# Patient Record
Sex: Male | Born: 1980 | Race: Black or African American | Hispanic: No | Marital: Married | Smoking: Never smoker
Health system: Southern US, Community
[De-identification: ages and names within clinical notes are randomized; demographics above are authoritative.]

---

## 2005-03-07 ENCOUNTER — Inpatient Hospital Stay (HOSPITAL_COMMUNITY): Admission: EM | Admit: 2005-03-07 | Discharge: 2005-03-12 | Payer: Self-pay | Admitting: Emergency Medicine

## 2005-03-07 ENCOUNTER — Ambulatory Visit: Payer: Self-pay | Admitting: Emergency Medicine

## 2005-03-08 ENCOUNTER — Other Ambulatory Visit: Admission: RE | Admit: 2005-03-08 | Discharge: 2005-03-08 | Payer: Self-pay | Admitting: Internal Medicine

## 2005-03-17 ENCOUNTER — Ambulatory Visit: Payer: Self-pay | Admitting: Emergency Medicine

## 2005-03-25 ENCOUNTER — Ambulatory Visit (HOSPITAL_COMMUNITY): Admission: RE | Admit: 2005-03-25 | Discharge: 2005-03-25 | Payer: Self-pay | Admitting: Emergency Medicine

## 2005-04-12 ENCOUNTER — Ambulatory Visit: Payer: Self-pay | Admitting: Emergency Medicine

## 2005-04-28 ENCOUNTER — Ambulatory Visit: Payer: Self-pay | Admitting: Emergency Medicine

## 2005-06-06 ENCOUNTER — Ambulatory Visit: Payer: Self-pay | Admitting: Internal Medicine

## 2005-09-13 ENCOUNTER — Ambulatory Visit: Payer: Self-pay | Admitting: Emergency Medicine

## 2005-11-08 ENCOUNTER — Ambulatory Visit: Payer: Self-pay | Admitting: Emergency Medicine

## 2005-12-02 ENCOUNTER — Ambulatory Visit: Payer: Self-pay | Admitting: Emergency Medicine

## 2006-09-12 IMAGING — CR DG CHEST 2V
2 series · 2 of 2 positions shown · non-contrast
Comparison: none

CLINICAL DATA: Cough and fever. 
 CHEST - 2 VIEW: 
 Mediastinal/hilar prominence on the lateral film is worrisome for adenopathy.  Peribronchial thickening is identified. No definite focal airspace disease or pleural effusions.  Bony thorax is unremarkable.

[w chest pa]
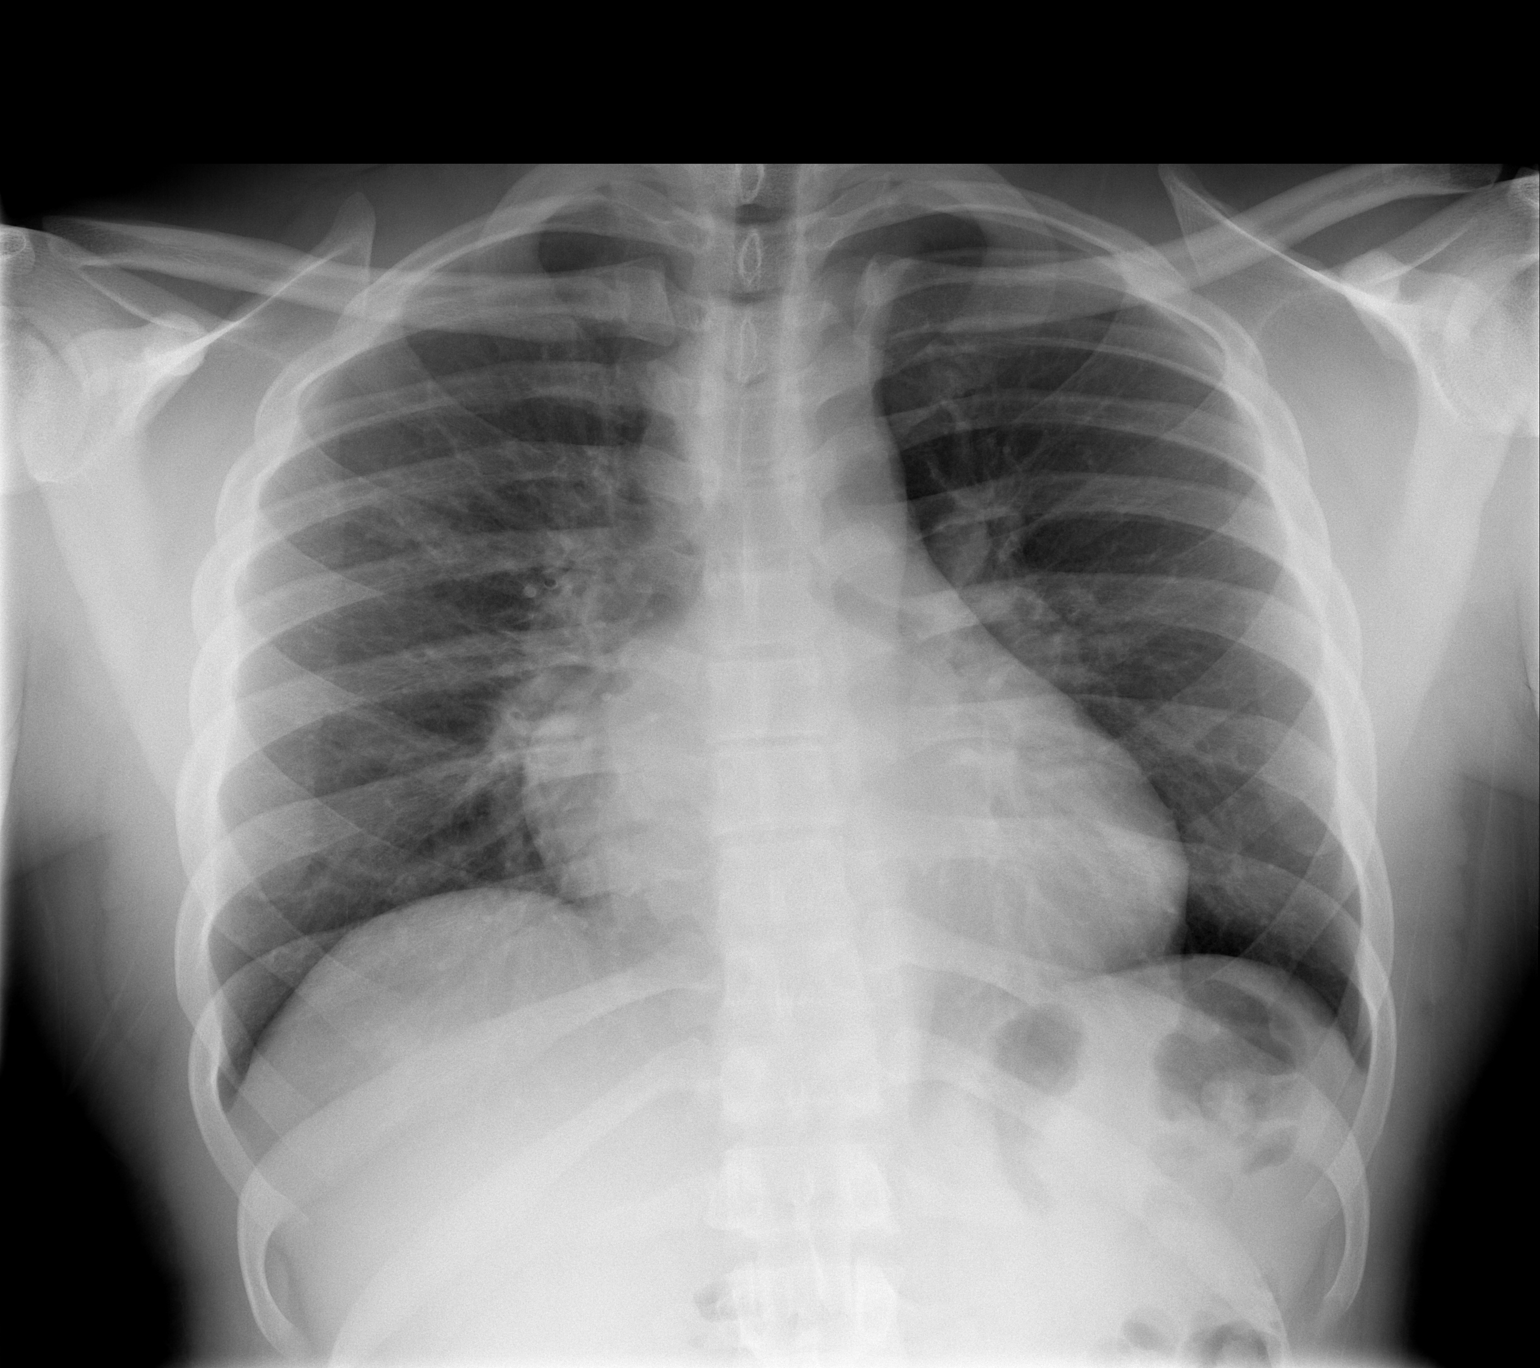

[w chest lat]
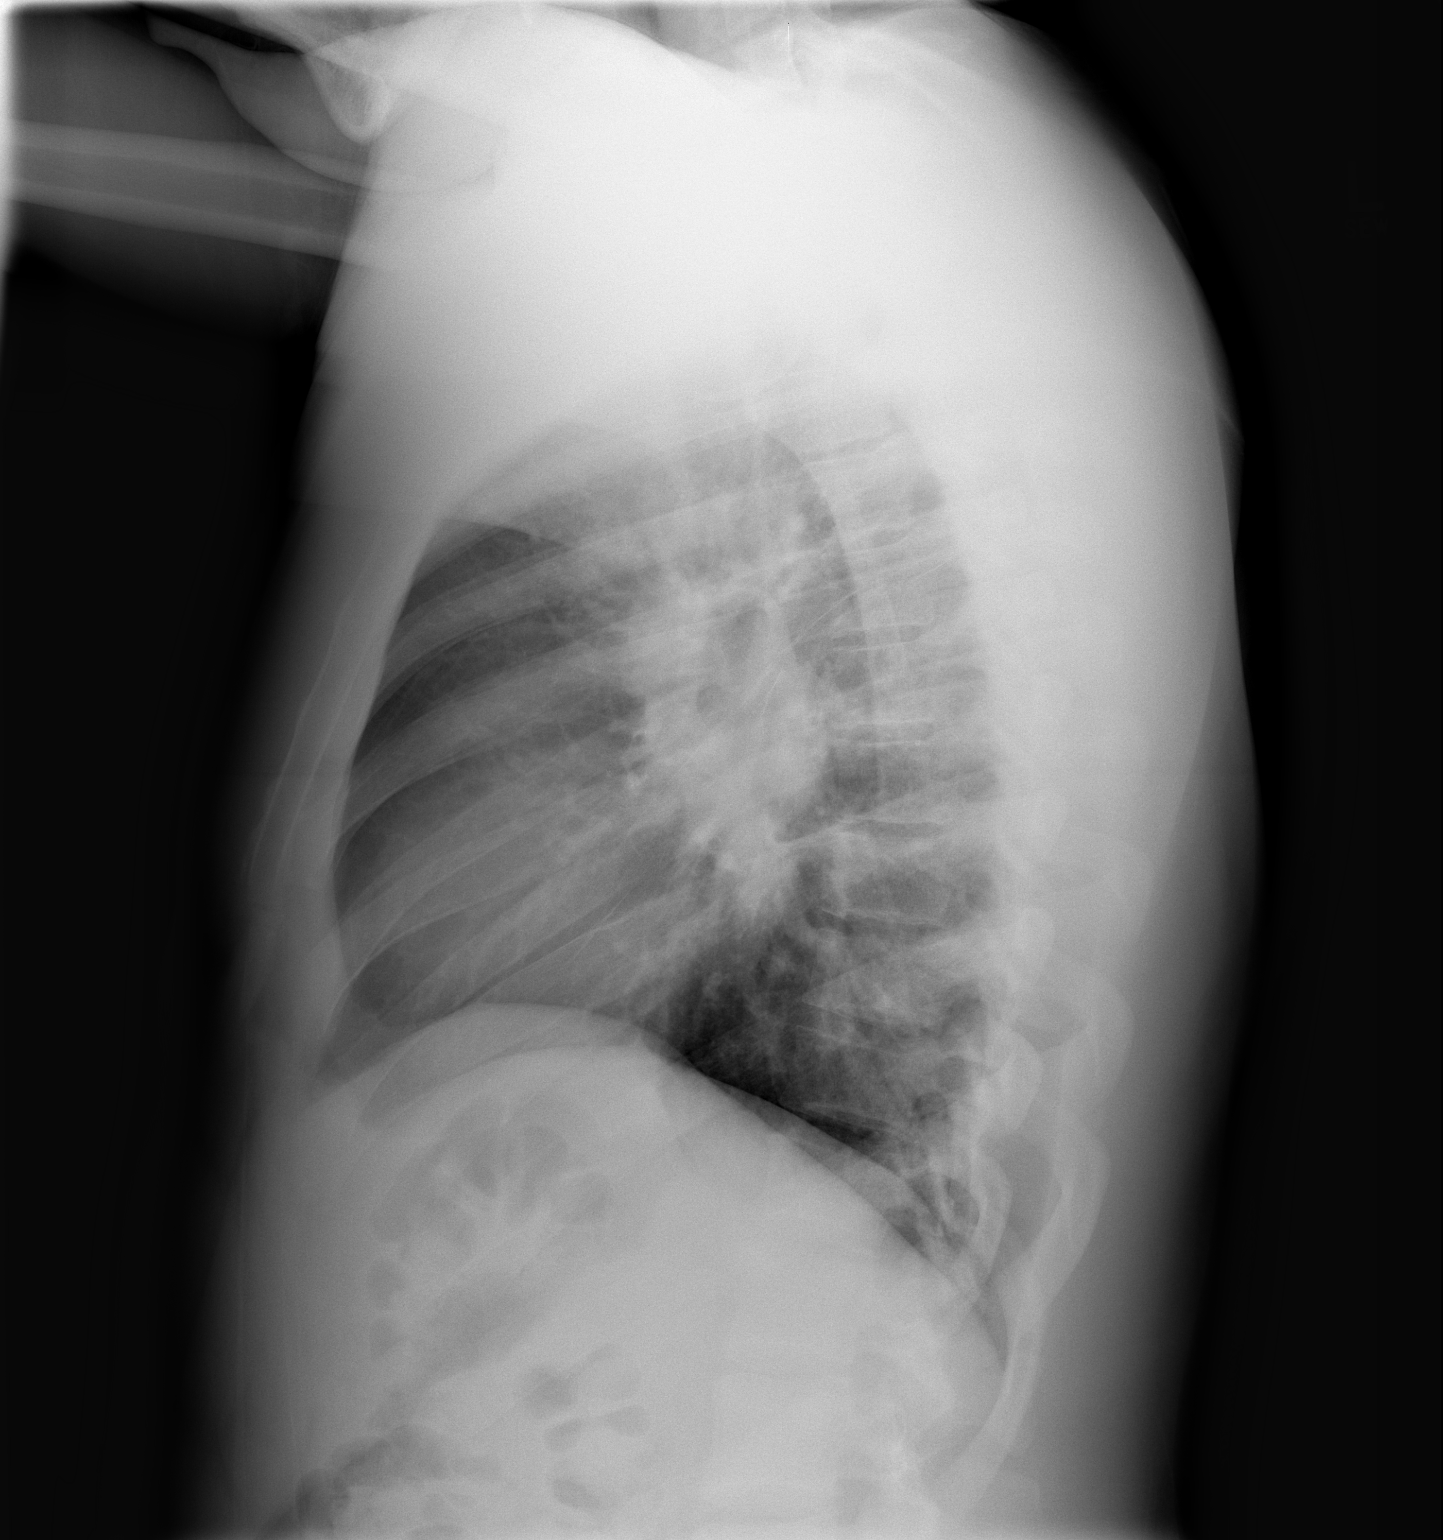

[2 of 2 positions shown; findings below may reference images not displayed]

IMPRESSION: 1.  Hilar/mediastinal prominence on the lateral film worrisome for adenopathy.  Recommend CT for further evaluation. 
 2.  Peribronchial thickening without focal airspace disease.

## 2007-03-24 DIAGNOSIS — R0989 Other specified symptoms and signs involving the circulatory and respiratory systems: Secondary | ICD-10-CM

## 2007-03-24 DIAGNOSIS — R0609 Other forms of dyspnea: Secondary | ICD-10-CM | POA: Insufficient documentation

## 2007-03-24 DIAGNOSIS — D869 Sarcoidosis, unspecified: Secondary | ICD-10-CM | POA: Insufficient documentation

## 2016-02-18 ENCOUNTER — Encounter: Payer: Self-pay | Admitting: *Deleted

## 2016-02-18 ENCOUNTER — Emergency Department (INDEPENDENT_AMBULATORY_CARE_PROVIDER_SITE_OTHER)
Admission: EM | Admit: 2016-02-18 | Discharge: 2016-02-18 | Disposition: A | Discharge status: Home / Self Care | Payer: 59 | Source: Home / Self Care | Attending: Family Medicine | Admitting: Family Medicine

## 2016-02-18 DIAGNOSIS — S39012A Strain of muscle, fascia and tendon of lower back, initial encounter: Secondary | ICD-10-CM | POA: Diagnosis not present

## 2016-02-18 MED ORDER — MELOXICAM 15 MG PO TABS: 15.0000 mg | ORAL_TABLET | Freq: Every day | ORAL | 0 refills | Status: AC

## 2016-02-18 MED ORDER — CYCLOBENZAPRINE HCL 10 MG PO TABS: 10.0000 mg | ORAL_TABLET | Freq: Three times a day (TID) | ORAL | 0 refills | Status: AC

## 2016-02-18 MED ORDER — IBUPROFEN 600 MG PO TABS
600.0000 mg | ORAL_TABLET | Freq: Once | ORAL | Status: AC
  Administered 2016-02-18: 600 mg via ORAL

## 2016-02-18 MED ORDER — HYDROCODONE-ACETAMINOPHEN 5-325 MG PO TABS: ORAL_TABLET | ORAL | 0 refills | Status: AC

## 2016-02-18 NOTE — ED Triage Notes (Signed)
Pt reports falling down 1 stair 5 days ago. C/o low back pain that radiates to his hips since. No previous injuries. Taking Aleve and Tylenol @ home.

## 2016-02-18 NOTE — Discharge Instructions (Signed)
Apply ice pack for 20 to 30 minutes, 3 to 4 times daily  Continue until pain decreases.  °Begin range of motion and stretching exercises as tolerated. °

## 2016-02-18 NOTE — ED Provider Notes (Signed)
[facility]    CSN: [id number] Arrival date & time: 02/18/16  [id number]     History   Chief Complaint Chief Complaint  Patient presents with  . Back Pain    HPI Jose Quinn is a 35 y.o. male.   Six days ago while walking down stairs, patient missed the last step and almost fell but was able to right himself.  He had no immediate pain, but later in the night and the next morning he developed soreness in his left lower back.  The pain has persisted but does not radiate.   He denies bowel or bladder dysfunction, and no saddle numbness.   The pain is worse each morning upon arising, and his back then "loosens up."   The history is provided by the patient.  Back Pain  Location:  Lumbar spine Quality:  Aching Radiates to:  Does not radiate Pain severity:  Moderate Pain is:  Worse during the night Onset quality:  Gradual Duration:  5 days Timing:  Intermittent Progression:  Unchanged Chronicity:  New Context: falling   Relieved by:  Nothing Worsened by:  Bending Ineffective treatments:  NSAIDs Associated symptoms: no abdominal pain, no abdominal swelling, no bladder incontinence, no bowel incontinence, no chest pain, no dysuria, no fever, no headaches, no leg pain, no numbness, no paresthesias, no pelvic pain, no perianal numbness, no tingling, no weakness and no weight loss     History reviewed. No pertinent past medical history.  Patient Active Problem List   Diagnosis Date Noted  . 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 DYSPNEA ON EXERTION 03/24/2007    History reviewed. No pertinent surgical history.     Home Medications    Prior to Admission medications   Medication Sig Start Date End Date Taking? Authorizing Provider  EPINEPHrine 0.3 mg/0.3 mL IJ SOAJ injection Inject into the muscle once.   Yes Historical Provider, MD  cyclobenzaprine (FLEXERIL) 10 MG tablet Take 1 tablet (10 mg total) by mouth 3 (three) times daily. (for muscle spasm) 02/18/16    Lattie HawStephen A Evalise Abruzzese, MD  HYDROcodone-acetaminophen (NORCO/VICODIN) 5-325 MG tablet Take one by mouth at bedtime as needed for pain 02/18/16   Lattie HawStephen A Gordana Kewley, MD  meloxicam (MOBIC) 15 MG tablet Take 1 tablet (15 mg total) by mouth daily. Take with food each morning 02/18/16   Lattie HawStephen A Azriel Dancy, MD    Family History History reviewed. No pertinent family history.  Social History Social History  Substance Use Topics  . Smoking status: Never Smoker  . Smokeless tobacco: Never Used  . Alcohol use Yes     Allergies   Shellfish allergy   Review of Systems Review of Systems  Constitutional: Negative for fever and weight loss.  Cardiovascular: Negative for chest pain.  Gastrointestinal: Negative for abdominal pain and bowel incontinence.  Genitourinary: Negative for bladder incontinence, dysuria and pelvic pain.  Musculoskeletal: Positive for back pain.  Neurological: Negative for tingling, weakness, numbness, headaches and paresthesias.  All other systems reviewed and are negative.    Physical Exam Triage Vital Signs ED Triage Vitals  Enc Vitals Group     BP 02/18/16 0857 149/93     Pulse Rate 02/18/16 0857 77     Resp --      Temp 02/18/16 0857 98.2 F (36.8 C)     Temp Source 02/18/16 0857 Oral     SpO2 02/18/16 0857 99 %     Weight 02/18/16 0858 244 lb (110.7 kg)     Height --  Head Circumference --      Peak Flow --      Pain Score 02/18/16 0859 8     Pain Loc --      Pain Edu? --      Excl. in GC? --    No data found.   Updated Vital Signs BP 149/93 (BP Location: Left Arm)   Pulse 77   Temp 98.2 F (36.8 C) (Oral)   Wt 244 lb (110.7 kg)   SpO2 99%   Visual Acuity Right Eye Distance:   Left Eye Distance:   Bilateral Distance:    Right Eye Near:   Left Eye Near:    Bilateral Near:     Physical Exam  Constitutional: He appears well-developed and well-nourished. No distress.  HENT:  Head: Atraumatic.  Mouth/Throat: Oropharynx is clear and moist.    Eyes: Conjunctivae and EOM are normal. Pupils are equal, round, and reactive to light.  Neck: Normal range of motion.  Cardiovascular: Normal heart sounds.   Pulmonary/Chest: Breath sounds normal.  Abdominal: Soft. There is no tenderness.  Musculoskeletal:       Lumbar back: He exhibits tenderness. He exhibits normal range of motion, no bony tenderness, no swelling and no edema.       Back:  Back:  Range of motion relatively well preserved.  Can heel/toe walk and squat without difficulty.    Tenderness in bilateral paraspinous muscles from L4 to Sacral area.  Straight leg raising test is negative.  Sitting knee extension test is negative.  Strength and sensation in the lower extremities is normal.  Patellar and achilles reflexes are normal   Neurological: He is alert.  Skin: Skin is warm and dry. No rash noted.  Nursing note and vitals reviewed.    UC Treatments / Results  Labs (all labs ordered are listed, but only abnormal results are displayed) Labs Reviewed - No data to display  EKG  EKG Interpretation None       Radiology No results found.  Procedures Procedures (including critical care time)  Medications Ordered in UC Medications  ibuprofen (ADVIL,MOTRIN) tablet 600 mg (600 mg Oral Given 02/18/16 0900)     Initial Impression / Assessment and Plan / UC Course  I have reviewed the triage vital signs and the nursing notes.  Pertinent labs & imaging results that were available during my care of the patient were reviewed by me and considered in my medical decision making (see chart for details).  Clinical Course  Begin Mobic 15mg  daily, and Flexeril 10mg  TID.  Lortab for pain at night. Apply ice pack for 20 to 30 minutes, 3 to 4 times daily  Continue until pain decreases. Begin range of motion and stretching exercises as tolerated. Followup with Dr. Rodney Langton or Dr. Clementeen [facility] ([facility]) if not improving about two weeks.      Final  Clinical Impressions(s) / UC Diagnoses   Final diagnoses:  Strain of lumbar region, initial encounter    New Prescriptions New Prescriptions   CYCLOBENZAPRINE (FLEXERIL) 10 MG TABLET    Take 1 tablet (10 mg total) by mouth 3 (three) times daily. (for muscle spasm)   HYDROCODONE-ACETAMINOPHEN (NORCO/VICODIN) 5-325 MG TABLET    Take one by mouth at bedtime as needed for pain   MELOXICAM (MOBIC) 15 MG TABLET    Take 1 tablet (15 mg total) by mouth daily. Take with food each morning     Lattie Haw, MD 02/18/16 [phone]
